# Patient Record
Sex: Female | Born: 1945 | Race: White | Hispanic: No | Marital: Married | Smoking: Former smoker
Health system: Southern US, Community
[De-identification: ages and names within clinical notes are randomized; demographics above are authoritative.]

## PROBLEM LIST (undated history)

## (undated) DIAGNOSIS — Z8719 Personal history of other diseases of the digestive system: Secondary | ICD-10-CM

## (undated) DIAGNOSIS — Z9889 Other specified postprocedural states: Secondary | ICD-10-CM

## (undated) DIAGNOSIS — I10 Essential (primary) hypertension: Secondary | ICD-10-CM

## (undated) DIAGNOSIS — R059 Cough, unspecified: Secondary | ICD-10-CM

## (undated) DIAGNOSIS — Z923 Personal history of irradiation: Secondary | ICD-10-CM

## (undated) DIAGNOSIS — Z9221 Personal history of antineoplastic chemotherapy: Secondary | ICD-10-CM

## (undated) DIAGNOSIS — F32A Depression, unspecified: Secondary | ICD-10-CM

## (undated) DIAGNOSIS — J449 Chronic obstructive pulmonary disease, unspecified: Secondary | ICD-10-CM

## (undated) DIAGNOSIS — K219 Gastro-esophageal reflux disease without esophagitis: Secondary | ICD-10-CM

## (undated) DIAGNOSIS — R011 Cardiac murmur, unspecified: Secondary | ICD-10-CM

## (undated) DIAGNOSIS — R112 Nausea with vomiting, unspecified: Secondary | ICD-10-CM

## (undated) DIAGNOSIS — B449 Aspergillosis, unspecified: Secondary | ICD-10-CM

## (undated) DIAGNOSIS — F329 Major depressive disorder, single episode, unspecified: Secondary | ICD-10-CM

## (undated) DIAGNOSIS — J439 Emphysema, unspecified: Secondary | ICD-10-CM

## (undated) DIAGNOSIS — R05 Cough: Secondary | ICD-10-CM

## (undated) DIAGNOSIS — IMO0001 Reserved for inherently not codable concepts without codable children: Secondary | ICD-10-CM

## (undated) DIAGNOSIS — C801 Malignant (primary) neoplasm, unspecified: Secondary | ICD-10-CM

## (undated) DIAGNOSIS — D649 Anemia, unspecified: Secondary | ICD-10-CM

## (undated) HISTORY — PX: ABDOMINAL HYSTERECTOMY: SHX81

## (undated) HISTORY — PX: PORTACATH PLACEMENT: SHX2246

## (undated) HISTORY — PX: RADIOFREQUENCY ABLATION LUNG: SHX2294

## (undated) HISTORY — PX: BACK SURGERY: SHX140

---

## 2004-09-03 ENCOUNTER — Ambulatory Visit: Payer: Self-pay

## 2007-02-16 ENCOUNTER — Ambulatory Visit: Payer: Self-pay | Admitting: Emergency Medicine

## 2007-02-16 ENCOUNTER — Other Ambulatory Visit: Payer: Self-pay

## 2009-07-09 ENCOUNTER — Ambulatory Visit: Payer: Self-pay | Admitting: Family Medicine

## 2010-09-01 ENCOUNTER — Ambulatory Visit: Payer: Self-pay

## 2010-09-15 ENCOUNTER — Ambulatory Visit: Payer: Self-pay

## 2010-10-30 ENCOUNTER — Ambulatory Visit: Payer: Self-pay | Admitting: General Practice

## 2010-11-15 ENCOUNTER — Ambulatory Visit: Payer: Self-pay | Admitting: General Practice

## 2010-11-25 ENCOUNTER — Encounter: Payer: Self-pay | Admitting: General Practice

## 2013-03-08 ENCOUNTER — Ambulatory Visit: Payer: Self-pay | Admitting: Family Medicine

## 2013-11-27 ENCOUNTER — Encounter: Payer: Self-pay | Admitting: Pulmonary Disease

## 2013-12-12 ENCOUNTER — Encounter: Payer: Self-pay | Admitting: Pulmonary Disease

## 2014-01-11 ENCOUNTER — Encounter: Payer: Self-pay | Admitting: Pulmonary Disease

## 2014-01-30 ENCOUNTER — Ambulatory Visit: Payer: Self-pay | Admitting: Nurse Practitioner

## 2014-02-11 ENCOUNTER — Encounter: Payer: Self-pay | Admitting: Pulmonary Disease

## 2015-01-17 ENCOUNTER — Encounter: Payer: Self-pay | Admitting: Emergency Medicine

## 2015-01-17 ENCOUNTER — Emergency Department
Admission: EM | Admit: 2015-01-17 | Discharge: 2015-01-17 | Disposition: A | Discharge status: Home / Self Care | Payer: No Typology Code available for payment source | Attending: Emergency Medicine | Admitting: Emergency Medicine

## 2015-01-17 ENCOUNTER — Emergency Department: Payer: No Typology Code available for payment source

## 2015-01-17 DIAGNOSIS — Y998 Other external cause status: Secondary | ICD-10-CM | POA: Diagnosis not present

## 2015-01-17 DIAGNOSIS — Y9389 Activity, other specified: Secondary | ICD-10-CM | POA: Diagnosis not present

## 2015-01-17 DIAGNOSIS — S39012A Strain of muscle, fascia and tendon of lower back, initial encounter: Secondary | ICD-10-CM | POA: Insufficient documentation

## 2015-01-17 DIAGNOSIS — Y92410 Unspecified street and highway as the place of occurrence of the external cause: Secondary | ICD-10-CM | POA: Diagnosis not present

## 2015-01-17 DIAGNOSIS — S199XXA Unspecified injury of neck, initial encounter: Secondary | ICD-10-CM | POA: Diagnosis not present

## 2015-01-17 DIAGNOSIS — S5001XA Contusion of right elbow, initial encounter: Secondary | ICD-10-CM | POA: Insufficient documentation

## 2015-01-17 DIAGNOSIS — S3992XA Unspecified injury of lower back, initial encounter: Secondary | ICD-10-CM | POA: Diagnosis present

## 2015-01-17 DIAGNOSIS — M62830 Muscle spasm of back: Secondary | ICD-10-CM

## 2015-01-17 HISTORY — DX: Chronic obstructive pulmonary disease, unspecified: J44.9

## 2015-01-17 HISTORY — DX: Malignant (primary) neoplasm, unspecified: C80.1

## 2015-01-17 HISTORY — DX: Emphysema, unspecified: J43.9

## 2015-01-17 MED ORDER — OXYCODONE-ACETAMINOPHEN 5-325 MG PO TABS
1.0000 | ORAL_TABLET | Freq: Once | ORAL | Status: AC
  Administered 2015-01-17: 1 via ORAL

## 2015-01-17 MED ORDER — OXYCODONE-ACETAMINOPHEN 5-325 MG PO TABS
ORAL_TABLET | ORAL | Status: AC
  Administered 2015-01-17: 1 via ORAL
  Filled 2015-01-17: qty 1
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
## 2015-01-17 NOTE — ED Notes (Signed)
Patient with no complaints at this time. Respirations even and unlabored. Skin warm/dry. Discharge instructions reviewed with patient at this time. Patient given opportunity to voice concerns/ask questions.
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
## 2015-04-03 ENCOUNTER — Other Ambulatory Visit: Payer: Medicare Other

## 2015-04-08 ENCOUNTER — Encounter: Payer: Self-pay | Admitting: *Deleted

## 2015-04-08 DIAGNOSIS — H2512 Age-related nuclear cataract, left eye: Secondary | ICD-10-CM | POA: Diagnosis not present

## 2015-04-08 DIAGNOSIS — Z79899 Other long term (current) drug therapy: Secondary | ICD-10-CM | POA: Diagnosis not present

## 2015-04-08 DIAGNOSIS — J449 Chronic obstructive pulmonary disease, unspecified: Secondary | ICD-10-CM | POA: Diagnosis not present

## 2015-04-08 DIAGNOSIS — M810 Age-related osteoporosis without current pathological fracture: Secondary | ICD-10-CM | POA: Diagnosis not present

## 2015-04-08 DIAGNOSIS — Z8611 Personal history of tuberculosis: Secondary | ICD-10-CM | POA: Diagnosis not present

## 2015-04-08 DIAGNOSIS — F329 Major depressive disorder, single episode, unspecified: Secondary | ICD-10-CM | POA: Diagnosis not present

## 2015-04-08 DIAGNOSIS — Z87891 Personal history of nicotine dependence: Secondary | ICD-10-CM | POA: Diagnosis not present

## 2015-04-08 DIAGNOSIS — Z8501 Personal history of malignant neoplasm of esophagus: Secondary | ICD-10-CM | POA: Diagnosis not present

## 2015-04-08 DIAGNOSIS — K219 Gastro-esophageal reflux disease without esophagitis: Secondary | ICD-10-CM | POA: Diagnosis not present

## 2015-04-08 DIAGNOSIS — Z882 Allergy status to sulfonamides status: Secondary | ICD-10-CM | POA: Diagnosis not present

## 2015-04-08 DIAGNOSIS — I10 Essential (primary) hypertension: Secondary | ICD-10-CM | POA: Diagnosis not present

## 2015-04-08 DIAGNOSIS — R011 Cardiac murmur, unspecified: Secondary | ICD-10-CM | POA: Diagnosis not present

## 2015-04-17 ENCOUNTER — Ambulatory Visit: Payer: Medicare Other | Admitting: Anesthesiology

## 2015-04-17 ENCOUNTER — Ambulatory Visit
Admission: RE | Admit: 2015-04-17 | Discharge: 2015-04-17 | Disposition: A | Discharge status: Home / Self Care | Payer: Medicare Other | Source: Ambulatory Visit | Attending: Ophthalmology | Admitting: Ophthalmology

## 2015-04-17 ENCOUNTER — Encounter
Admission: RE | Disposition: A | Discharge status: Home / Self Care | Payer: Self-pay | Source: Ambulatory Visit | Attending: Ophthalmology

## 2015-04-17 ENCOUNTER — Encounter: Payer: Self-pay | Admitting: Anesthesiology

## 2015-04-17 DIAGNOSIS — K219 Gastro-esophageal reflux disease without esophagitis: Secondary | ICD-10-CM | POA: Insufficient documentation

## 2015-04-17 DIAGNOSIS — Z8501 Personal history of malignant neoplasm of esophagus: Secondary | ICD-10-CM | POA: Insufficient documentation

## 2015-04-17 DIAGNOSIS — Z8611 Personal history of tuberculosis: Secondary | ICD-10-CM | POA: Insufficient documentation

## 2015-04-17 DIAGNOSIS — H2512 Age-related nuclear cataract, left eye: Secondary | ICD-10-CM | POA: Diagnosis not present

## 2015-04-17 DIAGNOSIS — F329 Major depressive disorder, single episode, unspecified: Secondary | ICD-10-CM | POA: Insufficient documentation

## 2015-04-17 DIAGNOSIS — I10 Essential (primary) hypertension: Secondary | ICD-10-CM | POA: Insufficient documentation

## 2015-04-17 DIAGNOSIS — Z87891 Personal history of nicotine dependence: Secondary | ICD-10-CM | POA: Insufficient documentation

## 2015-04-17 DIAGNOSIS — Z79899 Other long term (current) drug therapy: Secondary | ICD-10-CM | POA: Insufficient documentation

## 2015-04-17 DIAGNOSIS — M810 Age-related osteoporosis without current pathological fracture: Secondary | ICD-10-CM | POA: Insufficient documentation

## 2015-04-17 DIAGNOSIS — R011 Cardiac murmur, unspecified: Secondary | ICD-10-CM | POA: Insufficient documentation

## 2015-04-17 DIAGNOSIS — J449 Chronic obstructive pulmonary disease, unspecified: Secondary | ICD-10-CM | POA: Insufficient documentation

## 2015-04-17 DIAGNOSIS — Z882 Allergy status to sulfonamides status: Secondary | ICD-10-CM | POA: Insufficient documentation

## 2015-04-17 HISTORY — DX: Major depressive disorder, single episode, unspecified: F32.9

## 2015-04-17 HISTORY — DX: Gastro-esophageal reflux disease without esophagitis: K21.9

## 2015-04-17 HISTORY — DX: Depression, unspecified: F32.A

## 2015-04-17 HISTORY — PX: CATARACT EXTRACTION W/PHACO: SHX586

## 2015-04-17 HISTORY — DX: Anemia, unspecified: D64.9

## 2015-04-17 HISTORY — DX: Cough: R05

## 2015-04-17 HISTORY — DX: Nausea with vomiting, unspecified: R11.2

## 2015-04-17 HISTORY — DX: Essential (primary) hypertension: I10

## 2015-04-17 HISTORY — DX: Other specified postprocedural states: Z98.890

## 2015-04-17 HISTORY — DX: Personal history of other diseases of the digestive system: Z87.19

## 2015-04-17 HISTORY — DX: Aspergillosis, unspecified: B44.9

## 2015-04-17 HISTORY — DX: Cough, unspecified: R05.9

## 2015-04-17 HISTORY — DX: Reserved for inherently not codable concepts without codable children: IMO0001

## 2015-04-17 HISTORY — DX: Cardiac murmur, unspecified: R01.1

## 2015-04-17 SURGERY — PHACOEMULSIFICATION, CATARACT, WITH IOL INSERTION
Anesthesia: Monitor Anesthesia Care | Site: Eye | Laterality: Left | Wound class: Clean

## 2015-04-17 MED ORDER — MOXIFLOXACIN HCL 0.5 % OP SOLN
OPHTHALMIC | Status: AC
  Filled 2015-04-17: qty 3

## 2015-04-17 MED ORDER — SODIUM CHLORIDE 0.9 % IV SOLN
INTRAVENOUS | Status: DC
Start: 2015-04-17 — End: 2015-04-17
  Administered 2015-04-17: 08:00:00 via INTRAVENOUS

## 2015-04-17 MED ORDER — TETRACAINE HCL 0.5 % OP SOLN
OPHTHALMIC | Status: AC
  Filled 2015-04-17: qty 2

## 2015-04-17 MED ORDER — TETRACAINE HCL 0.5 % OP SOLN
1.0000 [drp] | OPHTHALMIC | Status: DC | PRN
  Administered 2015-04-17: 1 [drp] via OPHTHALMIC

## 2015-04-17 MED ORDER — PHENYLEPHRINE HCL 10 % OP SOLN
OPHTHALMIC | Status: AC
  Filled 2015-04-17: qty 5

## 2015-04-17 MED ORDER — CEFUROXIME OPHTHALMIC INJECTION 1 MG/0.1 ML
INJECTION | OPHTHALMIC | Status: DC | PRN
  Administered 2015-04-17: .1 mL via INTRACAMERAL

## 2015-04-17 MED ORDER — BALANCED SALT IO SOLN
INTRAOCULAR | Status: DC | PRN
  Administered 2015-04-17: 150 mL via INTRAOCULAR

## 2015-04-17 MED ORDER — MIDAZOLAM HCL 2 MG/2ML IJ SOLN
INTRAMUSCULAR | Status: DC | PRN
  Administered 2015-04-17 (×2): 1 mg via INTRAVENOUS

## 2015-04-17 MED ORDER — LIDOCAINE HCL (PF) 4 % IJ SOLN
INTRAMUSCULAR | Status: AC
  Filled 2015-04-17: qty 5

## 2015-04-17 MED ORDER — CYCLOPENTOLATE HCL 2 % OP SOLN
OPHTHALMIC | Status: AC
  Filled 2015-04-17: qty 2

## 2015-04-17 MED ORDER — MOXIFLOXACIN HCL 0.5 % OP SOLN
1.0000 [drp] | OPHTHALMIC | Status: AC | PRN
  Administered 2015-04-17 (×3): 1 [drp] via OPHTHALMIC

## 2015-04-17 MED ORDER — LIDOCAINE HCL (PF) 4 % IJ SOLN
INTRAMUSCULAR | Status: DC | PRN
  Administered 2015-04-17: 1 mL

## 2015-04-17 MED ORDER — LIDOCAINE HCL (PF) 1 % IJ SOLN
INTRAMUSCULAR | Status: AC
  Filled 2015-04-17: qty 2

## 2015-04-17 MED ORDER — EPINEPHRINE HCL 1 MG/ML IJ SOLN
INTRAMUSCULAR | Status: AC
  Filled 2015-04-17: qty 1

## 2015-04-17 MED ORDER — NA HYALUR & NA CHOND-NA HYALUR 0.4-0.35 ML IO KIT
PACK | INTRAOCULAR | Status: DC | PRN
  Administered 2015-04-17: .75 mL via INTRAOCULAR

## 2015-04-17 MED ORDER — CYCLOPENTOLATE HCL 2 % OP SOLN
1.0000 [drp] | OPHTHALMIC | Status: DC | PRN
  Administered 2015-04-17 (×3): 1 [drp] via OPHTHALMIC

## 2015-04-17 MED ORDER — NA HYALUR & NA CHOND-NA HYALUR 0.55-0.5 ML IO KIT
PACK | INTRAOCULAR | Status: AC
  Filled 2015-04-17: qty 1.05

## 2015-04-17 MED ORDER — CEFUROXIME OPHTHALMIC INJECTION 1 MG/0.1 ML
INJECTION | OPHTHALMIC | Status: AC
  Filled 2015-04-17: qty 0.1

## 2015-04-17 MED ORDER — MOXIFLOXACIN HCL 0.5 % OP SOLN
OPHTHALMIC | Status: DC | PRN
  Administered 2015-04-17: 2 [drp] via OPHTHALMIC

## 2015-04-17 MED ORDER — PHENYLEPHRINE HCL 10 % OP SOLN
1.0000 [drp] | OPHTHALMIC | Status: DC | PRN
  Administered 2015-04-17 (×3): 1 [drp] via OPHTHALMIC

## 2015-04-17 SURGICAL SUPPLY — 24 items
BSS 15 ML ×3 IMPLANT
CANNULA ANT/CHMB 27GA (MISCELLANEOUS) ×3 IMPLANT
CUP MEDICINE 2OZ PLAST GRAD ST (MISCELLANEOUS) ×3 IMPLANT
GLOVE BIO SURGEON STRL SZ7 (GLOVE) ×3 IMPLANT
GLOVE SURG LX 6.5 MICRO (GLOVE) ×4
GLOVE SURG LX STRL 6.5 MICRO (GLOVE) ×2 IMPLANT
GOWN STRL REUS W/ TWL LRG LVL3 (GOWN DISPOSABLE) ×2 IMPLANT
GOWN STRL REUS W/TWL LRG LVL3 (GOWN DISPOSABLE) ×4
LENS IOL ACRSF IQ PC 20.0 (Intraocular Lens) ×1 IMPLANT
LENS IOL ACRYSOF IQ POST 20.0 (Intraocular Lens) ×3 IMPLANT
NEEDLE FILTER BLUNT 18X 1/2SAF (NEEDLE) ×2
NEEDLE FILTER BLUNT 18X1 1/2 (NEEDLE) ×1 IMPLANT
PACK CATARACT (MISCELLANEOUS) ×3 IMPLANT
PACK CATARACT BRASINGTON LX (MISCELLANEOUS) ×3 IMPLANT
PACK EYE AFTER SURG (MISCELLANEOUS) ×3 IMPLANT
SOL BSS BAG (MISCELLANEOUS) ×3
SOL PREP PVP 2OZ (MISCELLANEOUS) ×3
SOLUTION BSS BAG (MISCELLANEOUS) ×1 IMPLANT
SOLUTION PREP PVP 2OZ (MISCELLANEOUS) ×1 IMPLANT
SYR 3ML LL SCALE MARK (SYRINGE) ×6 IMPLANT
SYR TB 1ML 27GX1/2 LL (SYRINGE) ×3 IMPLANT
WATER STERILE IRR 1000ML POUR (IV SOLUTION) ×3 IMPLANT
WICK WITH COLLECTION BAG ×3 IMPLANT
WIPE NON LINTING 3.25X3.25 (MISCELLANEOUS) ×3 IMPLANT

## 2015-04-17 NOTE — Discharge Instructions (Signed)
AMBULATORY SURGERY  DISCHARGE INSTRUCTIONS   1) The drugs that you were given will stay in your system until tomorrow so for the next 24 hours you should not:  A) Drive an automobile B) Make any legal decisions C) Drink any alcoholic beverage   2) You may resume regular meals tomorrow.  Today it is better to start with liquids and gradually work up to solid foods.  You may eat anything you prefer, but it is better to start with liquids, then soup and crackers, and gradually work up to solid foods.   3) Please notify your doctor immediately if you have any unusual bleeding, trouble breathing, redness and pain at the surgery site, drainage, fever, or pain not relieved by medication.    4) Additional Instructions:   Eye Surgery Discharge Instructions  Expect mild scratchy sensation or mild soreness. DO NOT RUB YOUR EYE!  The day of surgery:  Minimal physical activity, but bed rest is not required  No reading, computer work, or close hand work  No bending, lifting, or straining.  May watch TV  For 24 hours:  No driving, legal decisions, or alcoholic beverages  Safety precautions  Eat anything you prefer: It is better to start with liquids, then soup then solid foods.  _____ Eye patch should be worn until postoperative exam tomorrow.  ____ Solar shield eyeglasses should be worn for comfort in the sunlight/patch while sleeping  Resume all regular medications including aspirin or Coumadin if these were discontinued prior to surgery. You may shower, bathe, shave, or wash your hair. Tylenol may be taken for mild discomfort.  Call your doctor if you experience significant pain, nausea, or vomiting, fever > 101 or other signs of infection. [phone] or [phone] Specific instructions:  Follow-up Information    Follow up with Lyla Glassing, MD In 1 day.   Specialty:  Ophthalmology   Why:  August 5 at 11:00am   Contact information:   [place] [address] [phone]          Please contact your physician with any problems or Same Day Surgery at [phone], Monday through Friday 6 am to 4 pm, or [place] at [facility] number at [phone].

## 2015-04-17 NOTE — H&P (Signed)
The history and physical was faxed to the hospital. The history and physical was reviewed by me and no changes have occurred.   

## 2015-04-17 NOTE — Anesthesia Preprocedure Evaluation (Addendum)
Anesthesia Evaluation  Patient identified by MRN, date of birth, ID band Patient awake    Reviewed: Allergy & Precautions, NPO status , Patient's Chart, lab work & pertinent test results, reviewed documented beta blocker date and time   History of Anesthesia Complications (+) PONV and history of anesthetic complications  Airway Mallampati: II  TM Distance: >3 FB     Dental  (+) Chipped   Pulmonary shortness of breath, COPDformer smoker,          Cardiovascular hypertension, Pt. on medications + Valvular Problems/Murmurs     Neuro/Psych PSYCHIATRIC DISORDERS Depression    GI/Hepatic hiatal hernia, GERD-  Controlled,  Endo/Other    Renal/GU      Musculoskeletal   Abdominal   Peds  Hematology  (+) anemia ,   Anesthesia Other Findings Had pneumothorax with ablation.  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  Reproductive/Obstetrics                            Anesthesia Physical Anesthesia Plan  ASA: III  Anesthesia Plan: MAC   Post-op Pain Management:    Induction:   Airway Management Planned: Nasal Cannula  Additional Equipment:   Intra-op Plan:   Post-operative Plan:   Informed Consent: I have reviewed the patients History and Physical, chart, labs and discussed the procedure including the risks, benefits and alternatives for the proposed anesthesia with the patient or authorized representative who has indicated his/her understanding and acceptance.     Plan Discussed with: CRNA  Anesthesia Plan Comments:         Anesthesia Quick Evaluation

## 2015-04-17 NOTE — Op Note (Signed)
04/17/2015  PRE-OP DIAGNOSIS: Cataract (ICD-10 H25.12) Nuclear sclerotic cataract, LEFT EYE  Post operative diagnosis: Cataract (ICD-10 H25.12) Nuclear sclerotic cataract, LEFT EYE  Procedure: Phacoemulsification with introcular lens [id number])   SURGEON: Surgeon(s) and Role:    * Lyla Glassing, MD - Primary  ANESTHESIA: Topical   ESTIMATED BLOOD LOSS: MINIMAL  COMPLICATIONS: None  OPERATIVE DESCRIPTION:  Therapeutic options were discussed with the patient preoperatively, including a discussion of risks and benefits of surgery.  Informed consent was obtained. A dilated fundus exam was performed within 6 months.   The patient was premedicated and brought to the operating room and placed on the operating table in the supine position.  Topical tetracaine was instilled.  After adequate anesthesia, the patient was prepped and draped in the usual fashion.  A wire lid speculum was inserted and the microscope was positioned.  A sideport was used to create a paracentesis site and a mixture of preservative-free lidocaine, BSS, and epinephrine was was instilled into the anterior chamber, followed by viscoelastic.  A clear corneal incision was created using a keratome blade.  Capsulorrhexis was then performed.  In situ phacoemulsification was performed.  Cortical material was removed with the irrigation-aspiration unit.  Viscoelastic was instilled to open the capsular bag.  A posterior chamber intraocular lens, model 20.0 diopters, was inserted and positioned.  Irrigation-aspiration was used to remove all viscoelastic. Intracameral cefuroxime was injected into the eye. Wounds were checked for leakage and confirmed to be secure.  Lid speculum was removed and a shield was placed over the eye.  Patient was returned to the recovery room in stable condition.  IMPLANTS:   Implant Name Type Inv. Item Serial No. Manufacturer Lot No. LRB No. Used  IMPLANT LENS - [id number] Intraocular Lens IMPLANT LENS  [id number] ALCON   Left 1     Postoperative care and discharge medication counseling was discussed with the patient or the parents prior to discharge

## 2015-04-17 NOTE — Transfer of Care (Signed)
Immediate Anesthesia Transfer of Care Note  Patient: Kaitlin Higgins  Procedure(s) Performed: Procedure(s) with comments: CATARACT EXTRACTION PHACO AND INTRAOCULAR LENS PLACEMENT (IOC) (Left) - casette lot #[id number] H [place] 01:03.9 AP% 14.6 CDE 9.33  Patient Location: PACU  Anesthesia Type:MAC  Level of Consciousness: awake, alert  and oriented  Airway & Oxygen Therapy: Patient Spontanous Breathing  Post-op Assessment: Report given to RN and Post -op Vital signs reviewed and stable  Post vital signs: Reviewed and stable  Last Vitals:  Filed Vitals:   04/17/15 0905  BP: 110/67  Pulse: 70  Temp: 36.6 C  Resp:     Complications: No apparent anesthesia complications

## 2015-04-17 NOTE — Anesthesia Postprocedure Evaluation (Signed)
  Anesthesia Post-op Note  Patient: Kaitlin Higgins  Procedure(s) Performed: Procedure(s) with comments: CATARACT EXTRACTION PHACO AND INTRAOCULAR LENS PLACEMENT (IOC) (Left) - casette lot #[id number] H [place] 01:03.9 AP% 14.6 CDE 9.33  Anesthesia type:MAC  Patient location: PACU  Post pain: Pain level controlled  Post assessment: Post-op Vital signs reviewed, Patient's Cardiovascular Status Stable, Respiratory Function Stable, Patent Airway and No signs of Nausea or vomiting  Post vital signs: Reviewed and stable  Last Vitals:  Filed Vitals:   04/17/15 0905  BP: 110/67  Pulse: 68  Temp: 36.6 C  Resp:     Level of consciousness: awake, alert  and patient cooperative  Complications: No apparent anesthesia complications

## 2015-06-30 IMAGING — CR DG LUMBAR SPINE 2-3V
1 series · 3 of 3 positions shown · non-contrast
Comparison: None.

CLINICAL DATA: Low back pain

EXAM:
LUMBAR SPINE - 2-3 VIEW

[Series 1: ap · 0.17mm/px · 3 of 3 slices shown]
[im 1/3]
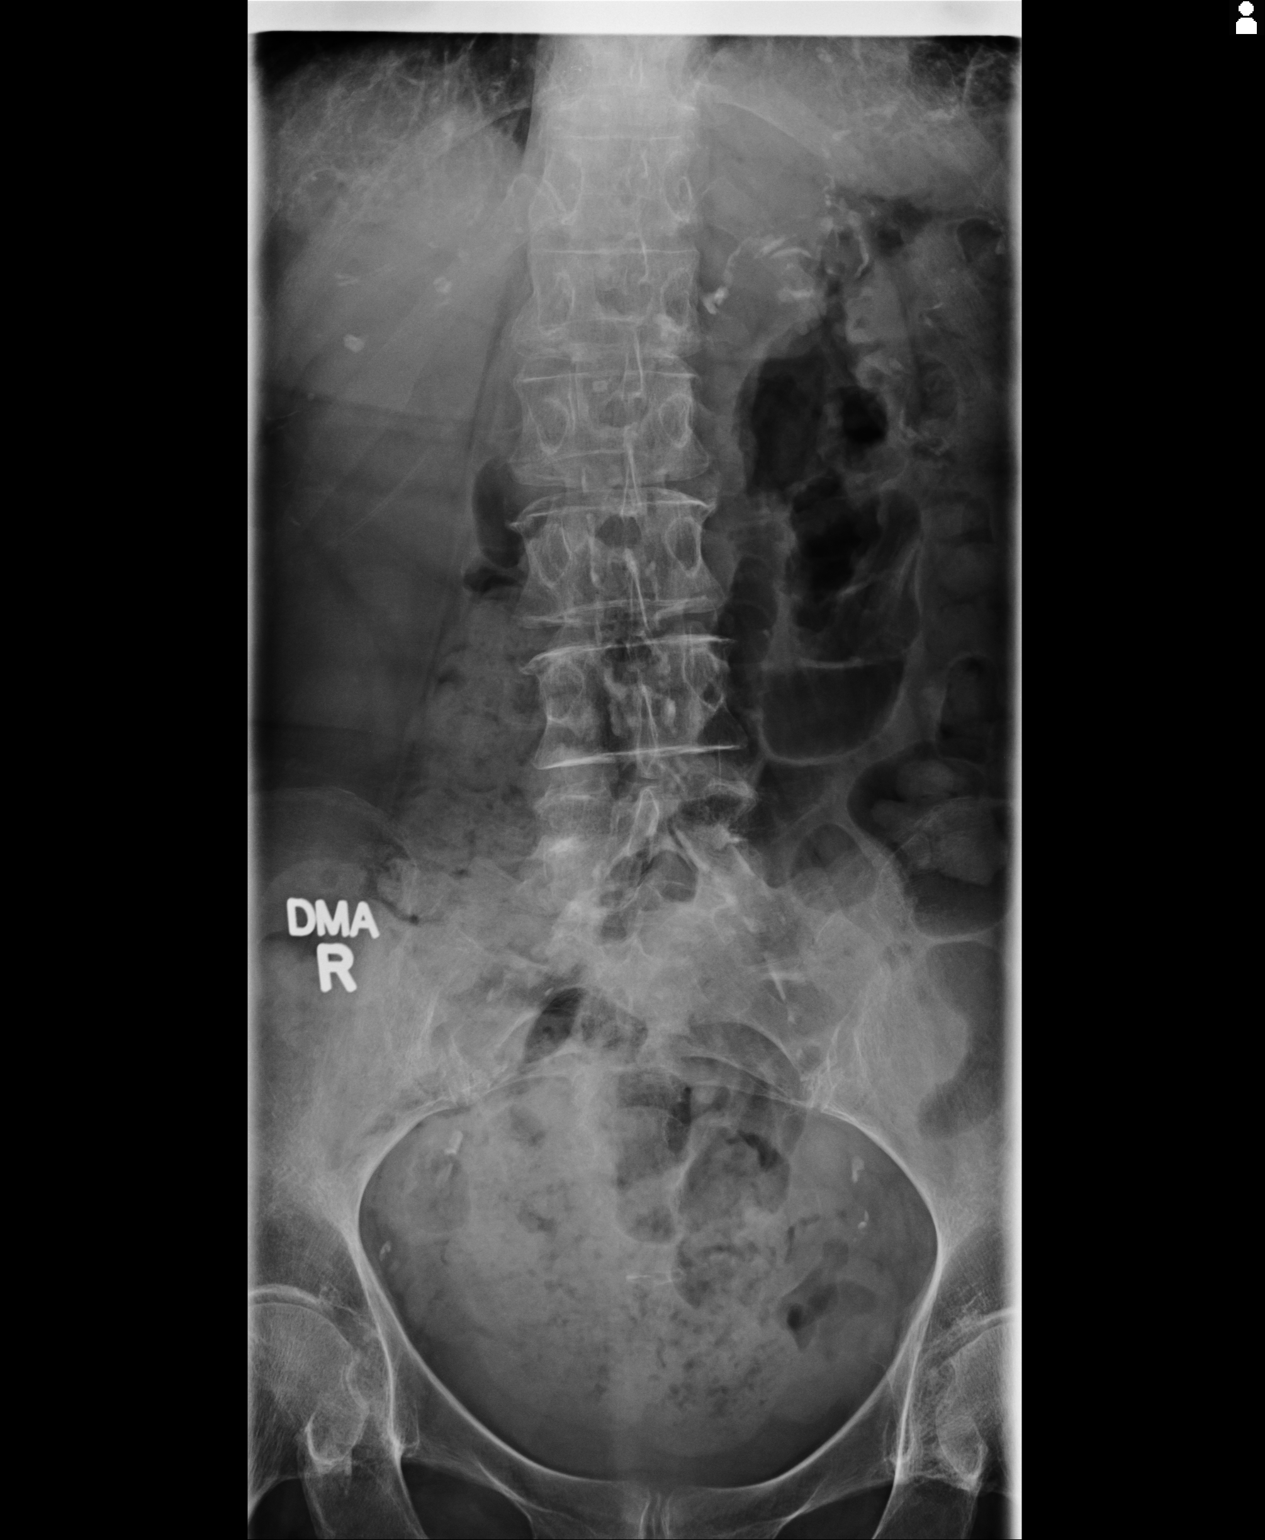
[im 2/3]
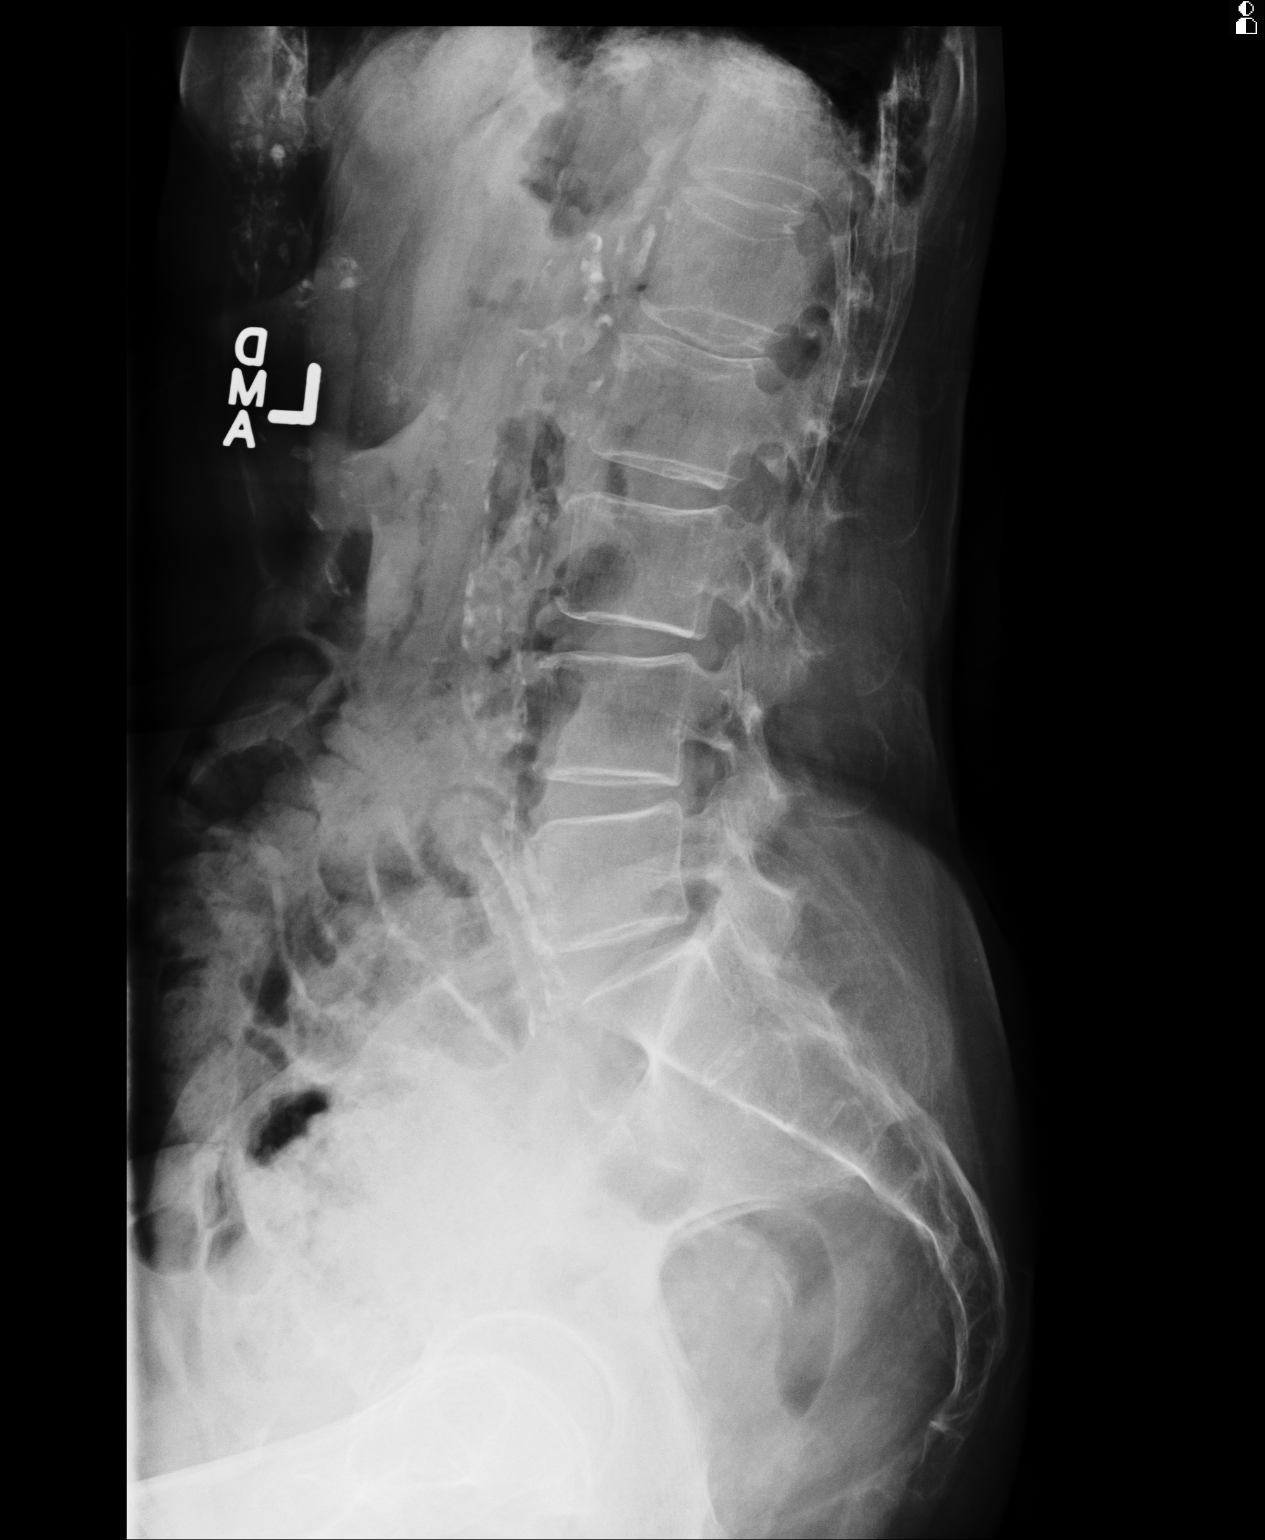
[im 3/3]
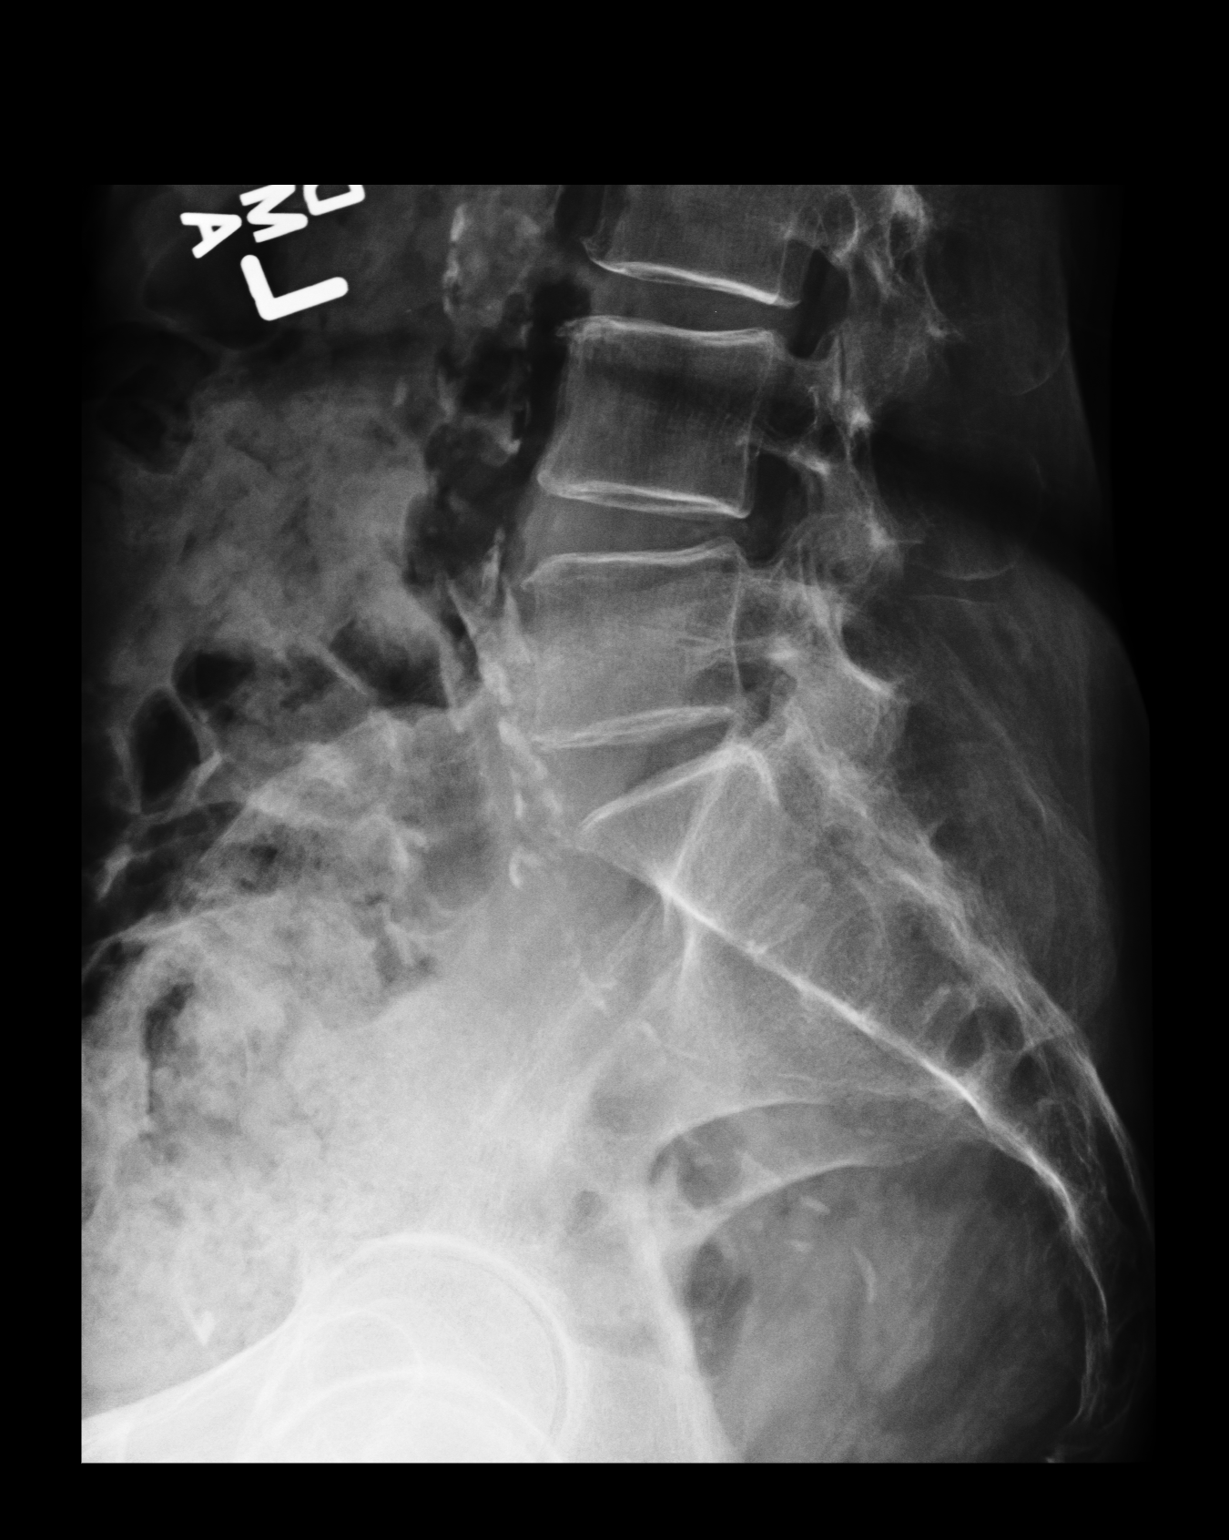

[3 of 3 positions shown; findings below may reference images not displayed]

FINDINGS: Five lumbar type vertebral bodies are well visualized. Vertebral
body height is well maintained. No spondylolisthesis is seen.
Diffuse aortic calcifications are noted.
IMPRESSION: No acute abnormality seen.

## 2016-01-12 ENCOUNTER — Other Ambulatory Visit: Payer: Self-pay | Admitting: Family Medicine

## 2016-01-12 DIAGNOSIS — Z1231 Encounter for screening mammogram for malignant neoplasm of breast: Secondary | ICD-10-CM

## 2016-01-14 ENCOUNTER — Ambulatory Visit
Admission: RE | Admit: 2016-01-14 | Discharge: 2016-01-14 | Disposition: A | Discharge status: Home / Self Care | Payer: Medicare Other | Source: Ambulatory Visit | Attending: Family Medicine | Admitting: Family Medicine

## 2016-01-14 ENCOUNTER — Ambulatory Visit: Payer: Medicare Other

## 2016-01-14 DIAGNOSIS — Z1231 Encounter for screening mammogram for malignant neoplasm of breast: Secondary | ICD-10-CM | POA: Diagnosis not present
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
## 2017-06-20 ENCOUNTER — Other Ambulatory Visit: Payer: Self-pay | Admitting: Family Medicine

## 2017-06-20 DIAGNOSIS — Z1231 Encounter for screening mammogram for malignant neoplasm of breast: Secondary | ICD-10-CM

## 2017-07-18 ENCOUNTER — Ambulatory Visit: Payer: Medicare Other

## 2017-08-02 ENCOUNTER — Ambulatory Visit
Admission: RE | Admit: 2017-08-02 | Discharge: 2017-08-02 | Disposition: A | Discharge status: Home / Self Care | Payer: Medicare Other | Source: Ambulatory Visit | Attending: Family Medicine | Admitting: Family Medicine

## 2017-08-02 DIAGNOSIS — Z1231 Encounter for screening mammogram for malignant neoplasm of breast: Secondary | ICD-10-CM | POA: Insufficient documentation

## 2017-08-02 HISTORY — DX: Personal history of irradiation: Z92.3

## 2017-08-02 HISTORY — DX: Personal history of antineoplastic chemotherapy: Z92.21

## 2018-05-19 MED ORDER — POLYETHYLENE GLYCOL 3350 17 G PO PACK
17.00 | PACK | ORAL | Status: DC
Start: ? — End: 2018-05-19

## 2018-05-19 MED ORDER — TIOTROPIUM BROMIDE MONOHYDRATE 18 MCG IN CAPS
18.00 | ORAL_CAPSULE | RESPIRATORY_TRACT | Status: DC
Start: 2018-05-20 — End: 2018-05-19

## 2018-05-19 MED ORDER — GENERIC EXTERNAL MEDICATION
3.38 | Status: DC
Start: 2018-05-19 — End: 2018-05-19

## 2018-05-19 MED ORDER — GENERIC EXTERNAL MEDICATION
5000.00 | Status: DC
Start: 2018-05-20 — End: 2018-05-19

## 2018-05-19 MED ORDER — DILTIAZEM HCL ER COATED BEADS 180 MG PO CP24
180.00 | ORAL_CAPSULE | ORAL | Status: DC
Start: 2018-05-20 — End: 2018-05-19

## 2018-05-19 MED ORDER — DEXTROSE 50 % IV SOLN
12.50 | INTRAVENOUS | Status: DC
Start: ? — End: 2018-05-19

## 2018-05-19 MED ORDER — GUAIFENESIN-DM 100-10 MG/5ML PO SYRP
10.00 | ORAL_SOLUTION | ORAL | Status: DC
Start: ? — End: 2018-05-19

## 2018-05-19 MED ORDER — GENERIC EXTERNAL MEDICATION
1.00 | Status: DC
Start: ? — End: 2018-05-19

## 2018-05-19 MED ORDER — IPRATROPIUM-ALBUTEROL 0.5-2.5 (3) MG/3ML IN SOLN
3.00 | RESPIRATORY_TRACT | Status: DC
Start: ? — End: 2018-05-19

## 2018-05-19 MED ORDER — SERTRALINE HCL 50 MG PO TABS
50.00 | ORAL_TABLET | ORAL | Status: DC
Start: 2018-05-20 — End: 2018-05-19

## 2018-05-19 MED ORDER — DOCUSATE SODIUM 100 MG PO CAPS
100.00 | ORAL_CAPSULE | ORAL | Status: DC
Start: ? — End: 2018-05-19

## 2018-05-19 MED ORDER — LIDOCAINE HCL 1 % IJ SOLN
.50 | INTRAMUSCULAR | Status: DC
Start: ? — End: 2018-05-19

## 2018-05-19 MED ORDER — CALCIUM CARBONATE-VITAMIN D 600-400 MG-UNIT PO TABS
1.00 | ORAL_TABLET | ORAL | Status: DC
Start: 2018-05-20 — End: 2018-05-19

## 2018-05-19 MED ORDER — ENOXAPARIN SODIUM 40 MG/0.4ML ~~LOC~~ SOLN
40.00 | SUBCUTANEOUS | Status: DC
Start: 2018-05-20 — End: 2018-05-19

## 2018-05-19 MED ORDER — SENNOSIDES-DOCUSATE SODIUM 8.6-50 MG PO TABS
2.00 | ORAL_TABLET | ORAL | Status: DC
Start: 2018-05-19 — End: 2018-05-19

## 2018-05-19 MED ORDER — VITAMIN C 500 MG PO TABS
1000.00 | ORAL_TABLET | ORAL | Status: DC
Start: 2018-05-20 — End: 2018-05-19

## 2018-05-19 MED ORDER — VORICONAZOLE 50 MG PO TABS
25.00 | ORAL_TABLET | ORAL | Status: DC
Start: 2018-05-19 — End: 2018-05-19

## 2019-09-17 ENCOUNTER — Ambulatory Visit: Payer: Medicare Other | Attending: Internal Medicine

## 2020-02-12 DEATH — deceased
